# Patient Record
Sex: Female | Born: 1965 | Race: White | Hispanic: No | Marital: Married | Smoking: Current every day smoker
Health system: Southern US, Community
[De-identification: ages and names within clinical notes are randomized; demographics above are authoritative.]

## PROBLEM LIST (undated history)

## (undated) DIAGNOSIS — I10 Essential (primary) hypertension: Secondary | ICD-10-CM

## (undated) DIAGNOSIS — R002 Palpitations: Secondary | ICD-10-CM

## (undated) DIAGNOSIS — F419 Anxiety disorder, unspecified: Secondary | ICD-10-CM

## (undated) HISTORY — DX: Essential (primary) hypertension: I10

## (undated) HISTORY — PX: TUBAL LIGATION: SHX77

## (undated) HISTORY — DX: Palpitations: R00.2

## (undated) HISTORY — DX: Anxiety disorder, unspecified: F41.9

---

## 2010-03-24 ENCOUNTER — Institutional Professional Consult (permissible substitution): Payer: Self-pay | Admitting: Cardiovascular Disease

## 2010-03-30 ENCOUNTER — Institutional Professional Consult (permissible substitution) (INDEPENDENT_AMBULATORY_CARE_PROVIDER_SITE_OTHER): Payer: BC Managed Care – PPO | Admitting: Cardiovascular Disease

## 2010-03-30 DIAGNOSIS — I10 Essential (primary) hypertension: Secondary | ICD-10-CM

## 2010-03-30 DIAGNOSIS — R002 Palpitations: Secondary | ICD-10-CM

## 2011-06-08 ENCOUNTER — Encounter: Payer: Self-pay | Admitting: *Deleted

## 2014-05-28 LAB — HM PAP SMEAR: HM Pap smear: NEGATIVE

## 2016-03-01 LAB — HM MAMMOGRAPHY

## 2016-05-10 ENCOUNTER — Encounter: Payer: Self-pay | Admitting: Osteopathic Medicine

## 2016-05-10 ENCOUNTER — Ambulatory Visit (INDEPENDENT_AMBULATORY_CARE_PROVIDER_SITE_OTHER): Payer: BLUE CROSS/BLUE SHIELD | Admitting: Osteopathic Medicine

## 2016-05-10 VITALS — BP 127/79 | HR 56 | Ht 61.0 in | Wt 142.0 lb

## 2016-05-10 DIAGNOSIS — D72829 Elevated white blood cell count, unspecified: Secondary | ICD-10-CM | POA: Diagnosis not present

## 2016-05-10 DIAGNOSIS — I10 Essential (primary) hypertension: Secondary | ICD-10-CM | POA: Diagnosis not present

## 2016-05-10 DIAGNOSIS — F329 Major depressive disorder, single episode, unspecified: Secondary | ICD-10-CM | POA: Diagnosis not present

## 2016-05-10 DIAGNOSIS — Z780 Asymptomatic menopausal state: Secondary | ICD-10-CM | POA: Diagnosis not present

## 2016-05-10 DIAGNOSIS — F32A Depression, unspecified: Secondary | ICD-10-CM

## 2016-05-10 DIAGNOSIS — Z0000 Encounter for general adult medical examination without abnormal findings: Secondary | ICD-10-CM

## 2016-05-10 MED ORDER — FLUOXETINE HCL 20 MG PO TABS: 20.0000 mg | ORAL_TABLET | Freq: Every day | ORAL | 1 refills | Status: DC

## 2016-05-10 MED ORDER — LISINOPRIL 20 MG PO TABS: 20.0000 mg | ORAL_TABLET | Freq: Every day | ORAL | 3 refills | Status: DC

## 2016-05-10 MED ORDER — FLUTICASONE PROPIONATE 50 MCG/ACT NA SUSP: 2.0000 | Freq: Every day | NASAL | 0 refills | Status: DC

## 2016-05-10 NOTE — Progress Notes (Signed)
HPI: Anna Wilkinson is a 51 y.o. female  who [facility] [facility], [facility],  for chief complaint of:  Chief Complaint  Patient presents with  . Establish Care    Hypertension: Well-controlled on current medications. No chest pain, pressure, shortness of breath.  Depression: Patient has been on and off of fluoxetine for the past several years. Typically will stop it when she starts until better, restarts due to depression/menopausal symptoms. Would like to discuss alternative to capsules as these feel like getting stuck in her throat on occasion.  Postmenopausal: Greater than 1 year since last menstrual period. Occasional mood swings/hot flashes.    Past medical, surgical, social and family history reviewed: Patient Active Problem List   Diagnosis Date Noted  . Essential hypertension 05/10/2016  . Depression 05/10/2016  . Postmenopausal 05/10/2016   Past Surgical History:  Procedure Laterality Date  . TUBAL LIGATION     Social History  Substance Use Topics  . Smoking status: Former Smoker    Years: 20.00    Quit date: 07/07/2009  . Smokeless tobacco: Never Used  . Alcohol use Yes   Family History  Problem Relation Age of Onset  . Heart attack Father   . Hypertension Father   . Stroke Father      Current medication list and allergy/intolerance information reviewed:   Current Outpatient Prescriptions  Medication Sig Dispense Refill  . FLUoxetine (PROZAC) 40 MG capsule Take 40 mg by mouth daily.    Marland Kitchen. lisinopril (PRINIVIL,ZESTRIL) 20 MG tablet Take 20 mg by mouth daily.     No current facility-administered medications for this visit.    Allergies  Allergen Reactions  . Sulfa Antibiotics       Review of Systems:  Constitutional:  No  fever, no chills, No recent illness, No unintentional weight changes. No significant fatigue.   HEENT: No  headache, no vision change, no hearing change, No sore throat, No  sinus  pressure  Cardiac: No  chest pain, No  pressure, No palpitations, No  Orthopnea  Respiratory:  No  shortness of breath. No  Cough  Gastrointestinal: No  abdominal pain, No  nausea, No  vomiting,  No  blood in stool, No  diarrhea, No  constipation   Musculoskeletal: No new myalgia/arthralgia  Genitourinary: No  incontinence, No  abnormal genital bleeding, No abnormal genital discharge  Skin: No  Rash, No other wounds/concerning lesions  Hem/Onc: No  easy bruising/bleeding, No  abnormal lymph node  Endocrine: No cold intolerance,  No heat intolerance. No polyuria/polydipsia/polyphagia   Neurologic: No  weakness, No  dizziness, No  slurred speech/focal weakness/facial droop  Psychiatric: No  concerns with depression, No  concerns with anxiety, No sleep problems, No mood problems  Exam:  BP 127/79   Pulse (!) 56   Ht 5\' 1"  (1.549 m)   Wt 142 lb (64.4 kg)   BMI 26.83 kg/m   Constitutional: VS see above. General Appearance: alert, well-developed, well-nourished, NAD  Eyes: Normal lids and conjunctive, non-icteric sclera  Ears, Nose, Mouth, Throat: MMM, Normal external inspection ears/nares/mouth/lips/gums.   Neck: No masses, trachea midline. No thyroid enlargement. No tenderness/mass appreciated. No lymphadenopathy  Respiratory: Normal respiratory effort. no wheeze, no rhonchi, no rales  Cardiovascular: S1/S2 normal, no murmur, no rub/gallop auscultated. RRR. No lower extremity edema.    Musculoskeletal: Gait normal. No clubbing/cyanosis of digits.   Neurological: Normal balance/coordination. No tremor.  Skin: warm, dry, intact. No rash/ulcer..Marland Kitchen  Psychiatric: Normal judgment/insight. Normal mood and affect. Oriented x3.     ASSESSMENT/PLAN:   Essential hypertension - Plan: lisinopril (PRINIVIL,ZESTRIL) 20 MG tablet, COMPLETE METABOLIC PANEL WITH GFR, Lipid panel  Depression, unspecified depression type - Trial tablets of fluoxetine per patient preference, advised  may cost more. She would like to try decreased dose. Advised stay on for 6 mos prevent recurrence - Plan: FLUoxetine (PROZAC) 20 MG tablet  Postmenopausal - Plan: VITAMIN D 25 Hydroxy (Vit-D Deficiency, Fractures)  Annual physical exam - Plan: CBC with Differential/Platelet, COMPLETE METABOLIC PANEL WITH GFR, Lipid panel, VITAMIN D 25 Hydroxy (Vit-D Deficiency, Fractures), TSH    Visit summary with medication list and pertinent instructions was printed for patient to review. All questions at time of visit were answered - patient instructed to contact office with any additional concerns. ER/RTC precautions were reviewed with the patient. Follow-up plan: Return for ANNUAL PHYSICAL next 1-2 months, get fasting labs prior to that visit .   Please note: Labs ordered today for future annual physical visit. Preventive carer visit was not performed or billed today

## 2016-05-24 ENCOUNTER — Encounter: Payer: Self-pay | Admitting: Osteopathic Medicine

## 2016-05-24 ENCOUNTER — Ambulatory Visit (INDEPENDENT_AMBULATORY_CARE_PROVIDER_SITE_OTHER): Payer: BLUE CROSS/BLUE SHIELD | Admitting: Osteopathic Medicine

## 2016-05-24 VITALS — BP 147/80 | HR 62 | Temp 97.5°F | Resp 16 | Wt 142.0 lb

## 2016-05-24 DIAGNOSIS — L299 Pruritus, unspecified: Secondary | ICD-10-CM

## 2016-05-24 MED ORDER — BETAMETHASONE DIPROPIONATE 0.05 % EX CREA: TOPICAL_CREAM | Freq: Two times a day (BID) | CUTANEOUS | 0 refills | Status: DC

## 2016-05-24 MED ORDER — METHYLPREDNISOLONE SODIUM SUCC 125 MG IJ SOLR
80.0000 mg | Freq: Once | INTRAMUSCULAR | Status: AC
  Administered 2016-05-24: 80 mg via INTRAMUSCULAR

## 2016-05-24 NOTE — Progress Notes (Signed)
HPI: Anna Wilkinson is a 51 y.o. female  who presents to [facility] today, 05/24/16,  for chief complaint of:  Chief Complaint  Patient presents with  . itching without rash   New problem: . Context: itching started on hands and feet, seemed to spread.  . Location: everywhere at this point but worse in hands, feet back . Quality: itching . Duration: 3 days . Modifying factors: no OTC meds tried for itching. From GYN recently given Clotrimazole + Betamethasone x3 applications to external genitalia which helped the yeast infection (Rx from GYN for yeast infection - OTC meds helped discharge but not irritation to skin of vulva).  . Assoc signs/symptoms: no fever, no rash, no joint pain/swelling     Past medical history, surgical history, social history and family history reviewed.  Patient Active Problem List   Diagnosis Date Noted  . Essential hypertension 05/10/2016  . Depression 05/10/2016  . Postmenopausal 05/10/2016    Current medication list and allergy/intolerance information reviewed.   Current Outpatient Prescriptions on File Prior to Visit  Medication Sig Dispense Refill  . FLUoxetine (PROZAC) 20 MG tablet Take 1 tablet (20 mg total) by mouth daily. 90 tablet 1  . fluticasone (FLONASE) 50 MCG/ACT nasal spray Place 2 sprays into both nostrils daily. 16 g 0  . lisinopril (PRINIVIL,ZESTRIL) 20 MG tablet Take 1 tablet (20 mg total) by mouth daily. 90 tablet 3   No current facility-administered medications on file prior to visit.    Allergies  Allergen Reactions  . Sulfa Antibiotics       Review of Systems:  Constitutional: No recent illness  HEENT: No  headache  Cardiac: No  chest pain  Respiratory:  No  shortness of breath  Musculoskeletal: No new myalgia/arthralgia  Skin: No  Rash  Neurologic: No  weakness, No  Dizziness   Exam:  BP (!) 147/80 (BP Location: Left Arm, Patient Position: Sitting, Cuff Size: Normal)    Pulse 62   Temp 97.5 F (36.4 C) (Oral)   Resp 16   Wt 142 lb (64.4 kg)   SpO2 98%   BMI 26.83 kg/m   Constitutional: VS see above. General Appearance: alert, well-developed, well-nourished, NAD  Eyes: Normal lids and conjunctive, non-icteric sclera  Ears, Nose, Mouth, Throat: MMM, Normal external inspection ears/nares/mouth/lips/gums.  Neck: No masses, trachea midline.   Respiratory: Normal respiratory effort. no wheeze, no rhonchi, no rales  Cardiovascular: S1/S2 normal, no murmur, no rub/gallop auscultated. RRR.   Musculoskeletal: Gait normal. Symmetric and independent movement of all extremities  Neurological: Normal balance/coordination. No tremor.  Skin: warm, dry, intact. No rash. Small excoriation on lower back c/w scratching   Psychiatric: Normal judgment/insight. Normal mood and affect. Oriented x3.      ASSESSMENT/PLAN: Pruritus w/o rash, no known exposure other than recent medication change to tablets from capsules  Itching - Plan: betamethasone dipropionate (DIPROLENE) 0.05 % cream, methylPREDNISolone sodium succinate (SOLU-MEDROL) 125 mg/2 mL injection 80 mg    Patient Instructions  Plan: 1. Shot of steroids today  2. Antihistamines at home: Claritin, Allegra or Zyrtec 3. Steroid cream for severe itching 4. Recheck this on Friday (or sooner if needed), may need to consider switching back to capsules of fluoxetine     Follow-up plan: Return for follow-up as scheduled later this week, sooner if worse/change.  Visit summary with medication list and pertinent instructions was printed for patient to review, alert [place] if any changes needed. All questions at time  of visit were answered - patient instructed to contact office with any additional concerns. ER/RTC precautions were reviewed with the patient and understanding verbalized.

## 2016-05-24 NOTE — Patient Instructions (Signed)
Plan: 1. Shot of steroids today  2. Antihistamines at home: Claritin, Allegra or Zyrtec 3. Steroid cream for severe itching 4. Recheck this on Friday (or sooner if needed), may need to consider switching back to capsules of fluoxetine

## 2016-05-25 LAB — CBC WITH DIFFERENTIAL/PLATELET
BASOS ABS: 0 {cells}/uL (ref 0–200)
BASOS PCT: 0 %
EOS ABS: 0 {cells}/uL — AB (ref 15–500)
EOS PCT: 0 %
HCT: 45.1 % — ABNORMAL HIGH (ref 35.0–45.0)
Hemoglobin: 15.4 g/dL (ref 11.7–15.5)
LYMPHS ABS: 2702 {cells}/uL (ref 850–3900)
Lymphocytes Relative: 14 %
MCH: 32.5 pg (ref 27.0–33.0)
MCHC: 34.1 g/dL (ref 32.0–36.0)
MCV: 95.1 fL (ref 80.0–100.0)
MONO ABS: 1351 {cells}/uL — AB (ref 200–950)
MPV: 9.1 fL (ref 7.5–12.5)
Monocytes Relative: 7 %
NEUTROS ABS: 15247 {cells}/uL — AB (ref 1500–7800)
Neutrophils Relative %: 79 %
PLATELETS: 298 10*3/uL (ref 140–400)
RBC: 4.74 MIL/uL (ref 3.80–5.10)
RDW: 13.2 % (ref 11.0–15.0)
WBC: 19.3 10*3/uL — ABNORMAL HIGH (ref 3.8–10.8)

## 2016-05-25 LAB — LIPID PANEL
CHOLESTEROL: 184 mg/dL (ref <200)
HDL: 66 mg/dL (ref >50)
LDL Cholesterol: 104 mg/dL — ABNORMAL HIGH (ref <100)
Total CHOL/HDL Ratio: 2.8 Ratio (ref <5.0)
Triglycerides: 71 mg/dL (ref <150)
VLDL: 14 mg/dL (ref <30)

## 2016-05-25 LAB — COMPLETE METABOLIC PANEL WITH GFR
ALT: 22 U/L (ref 6–29)
AST: 16 U/L (ref 10–35)
Albumin: 4.1 g/dL (ref 3.6–5.1)
Alkaline Phosphatase: 122 U/L (ref 33–130)
BILIRUBIN TOTAL: 0.5 mg/dL (ref 0.2–1.2)
BUN: 17 mg/dL (ref 7–25)
CHLORIDE: 109 mmol/L (ref 98–110)
CO2: 19 mmol/L — AB (ref 20–31)
Calcium: 9.4 mg/dL (ref 8.6–10.4)
Creat: 0.86 mg/dL (ref 0.50–1.05)
GFR, EST NON AFRICAN AMERICAN: 78 mL/min (ref >=60)
GFR, Est African American: >89 mL/min (ref >=60)
GLUCOSE: 81 mg/dL (ref 65–99)
Potassium: 4.1 mmol/L (ref 3.5–5.3)
SODIUM: 141 mmol/L (ref 135–146)
TOTAL PROTEIN: 6.4 g/dL (ref 6.1–8.1)

## 2016-05-25 LAB — TSH: TSH: 0.71 mIU/L

## 2016-05-26 ENCOUNTER — Telehealth: Payer: Self-pay | Admitting: *Deleted

## 2016-05-26 LAB — VITAMIN D 25 HYDROXY (VIT D DEFICIENCY, FRACTURES): Vit D, 25-Hydroxy: 12 ng/mL — ABNORMAL LOW (ref 30–100)

## 2016-05-26 NOTE — Addendum Note (Signed)
Addended by: Deirdre Pippins on: 05/26/2016 08:04 AM   Modules accepted: Orders

## 2016-05-26 NOTE — Telephone Encounter (Signed)
closed

## 2016-05-27 ENCOUNTER — Ambulatory Visit (INDEPENDENT_AMBULATORY_CARE_PROVIDER_SITE_OTHER): Payer: BLUE CROSS/BLUE SHIELD | Admitting: Osteopathic Medicine

## 2016-05-27 ENCOUNTER — Encounter: Payer: Self-pay | Admitting: Osteopathic Medicine

## 2016-05-27 VITALS — BP 136/89 | HR 55 | Wt 142.0 lb

## 2016-05-27 DIAGNOSIS — E559 Vitamin D deficiency, unspecified: Secondary | ICD-10-CM

## 2016-05-27 DIAGNOSIS — Z0000 Encounter for general adult medical examination without abnormal findings: Secondary | ICD-10-CM | POA: Diagnosis not present

## 2016-05-27 DIAGNOSIS — F339 Major depressive disorder, recurrent, unspecified: Secondary | ICD-10-CM

## 2016-05-27 DIAGNOSIS — Z1211 Encounter for screening for malignant neoplasm of colon: Secondary | ICD-10-CM

## 2016-05-27 DIAGNOSIS — D729 Disorder of white blood cells, unspecified: Secondary | ICD-10-CM | POA: Diagnosis not present

## 2016-05-27 MED ORDER — FLUOXETINE HCL 20 MG PO CAPS: 20.0000 mg | ORAL_CAPSULE | Freq: Every day | ORAL | 3 refills | Status: DC

## 2016-05-27 MED ORDER — VITAMIN D (ERGOCALCIFEROL) 1.25 MG (50000 UNIT) PO CAPS: 50000.0000 [IU] | ORAL_CAPSULE | ORAL | 0 refills | Status: DC

## 2016-05-27 NOTE — Progress Notes (Signed)
HPI: Anna Wilkinson is a 51 y.o. female  who presents to [place] today, 05/27/16,  for chief complaint of:  Chief Complaint  Patient presents with  . Annual Exam    No complaints today: Itching has resolved. See below for review of preventive care. Of note, leukocytosis demonstrated on labs, however patient did not get labs done at time of last visit but rather came back subsequent day after receiving steroids. Would like Prozac switch back to capsule form after tablets probably are only explanation for itching reaction.   Past medical, surgical, social and family history reviewed: Patient Active Problem List   Diagnosis Date Noted  . Essential hypertension 05/10/2016  . Depression 05/10/2016  . Postmenopausal 05/10/2016   Past Surgical History:  Procedure Laterality Date  . TUBAL LIGATION     Social History  Substance Use Topics  . Smoking status: Former Smoker    Years: 20.00    Quit date: 07/07/2009  . Smokeless tobacco: Never Used  . Alcohol use Yes   Family History  Problem Relation Age of Onset  . Heart attack Father   . Hypertension Father   . Stroke Father   . Cancer Father   . Heart disease Mother   . Hypertension Mother   . Cancer Brother      Current medication list and allergy/intolerance information reviewed:   Current Outpatient Prescriptions  Medication Sig Dispense Refill  . fluticasone (FLONASE) 50 MCG/ACT nasal spray Place 2 sprays into both nostrils daily. 16 g 0  . lisinopril (PRINIVIL,ZESTRIL) 20 MG tablet Take 1 tablet (20 mg total) by mouth daily. 90 tablet 3  . FLUoxetine (PROZAC) 20 MG tablet Take 1 tablet (20 mg total) by mouth daily. (Patient not taking: Reported on 05/27/2016) 90 tablet 1   No current facility-administered medications for this visit.    Allergies  Allergen Reactions  . Sulfa Antibiotics       Review of Systems:  Constitutional:  No  fever, no chills, No recent illness, No  unintentional weight changes. No significant fatigue.   HEENT: No  headache, no vision change, no hearing change, No sore throat, No  sinus pressure  Cardiac: No  chest pain, No  pressure, No palpitations  Respiratory:  No  shortness of breath. No  Cough  Gastrointestinal: No  abdominal pain, No  nausea, No  vomiting  Musculoskeletal: No new myalgia/arthralgia  Skin: No  Rash, No other wounds/concerning lesions  Neurologic: No  weakness, No  dizziness  Psychiatric: No  concerns with depression, No  concerns with anxiety, No sleep problems, No mood problems  Exam:  BP 136/89   Pulse (!) 55   Wt 142 lb (64.4 kg)   BMI 26.83 kg/m   Constitutional: VS see above. General Appearance: alert, well-developed, well-nourished, NAD  Eyes: Normal lids and conjunctive, non-icteric sclera  Ears, Nose, Mouth, Throat: MMM, Normal external inspection ears/nares/mouth/lips/gums.   Neck: No masses, trachea midline. No thyroid enlargement. No tenderness/mass appreciated. No lymphadenopathy  Respiratory: Normal respiratory effort. no wheeze, no rhonchi, no rales  Cardiovascular: S1/S2 normal, no murmur, no rub/gallop auscultated. RRR. No lower extremity edema.   Musculoskeletal: Gait normal. No clubbing/cyanosis of digits.   Neurological: Normal balance/coordination. No tremor.   Skin: warm, dry, intact. No rash/ulcer  Psychiatric: Normal judgment/insight. Normal mood and affect. Oriented x3.    Results for orders placed or performed in visit on 05/10/16 (from the past 72 hour(s))  CBC with Differential/Platelet  Status: Abnormal   Collection Time: 05/25/16  9:37 AM  Result Value Ref Range   WBC 19.3 (H) 3.8 - 10.8 K/uL   RBC 4.74 3.80 - 5.10 MIL/uL   Hemoglobin 15.4 11.7 - 15.5 g/dL   HCT 45.1 (H) 35.0 - 45.0 %   MCV 95.1 80.0 - 100.0 fL   MCH 32.5 27.0 - 33.0 pg   MCHC 34.1 32.0 - 36.0 g/dL   RDW 13.2 11.0 - 15.0 %   Platelets 298 140 - 400 K/uL   MPV 9.1 7.5 - 12.5 fL    Neutro Abs 15,247 (H) 1,500 - 7,800 cells/uL   Lymphs Abs 2,702 850 - 3,900 cells/uL   Monocytes Absolute 1,351 (H) 200 - 950 cells/uL   Eosinophils Absolute 0 (L) 15 - 500 cells/uL   Basophils Absolute 0 0 - 200 cells/uL   Neutrophils Relative % 79 %   Lymphocytes Relative 14 %   Monocytes Relative 7 %   Eosinophils Relative 0 %   Basophils Relative 0 %   Smear Review Criteria for review not met   COMPLETE METABOLIC PANEL WITH GFR     Status: Abnormal   Collection Time: 05/25/16  9:37 AM  Result Value Ref Range   Sodium 141 135 - 146 mmol/L   Potassium 4.1 3.5 - 5.3 mmol/L   Chloride 109 98 - 110 mmol/L   CO2 19 (L) 20 - 31 mmol/L   Glucose, Bld 81 65 - 99 mg/dL   BUN 17 7 - 25 mg/dL   Creat 0.86 0.50 - 1.05 mg/dL    Comment:   For patients > or = 53 years of age: The upper reference limit for Creatinine is approximately 13% higher for people identified as African-American.      Total Bilirubin 0.5 0.2 - 1.2 mg/dL   Alkaline Phosphatase 122 33 - 130 U/L   AST 16 10 - 35 U/L   ALT 22 6 - 29 U/L   Total Protein 6.4 6.1 - 8.1 g/dL   Albumin 4.1 3.6 - 5.1 g/dL   Calcium 9.4 8.6 - 10.4 mg/dL   GFR, Est African American >89 >=60 mL/min   GFR, Est Non African American 78 >=60 mL/min  Lipid panel     Status: Abnormal   Collection Time: 05/25/16  9:37 AM  Result Value Ref Range   Cholesterol 184 <200 mg/dL   Triglycerides 71 <150 mg/dL   HDL 66 >50 mg/dL   Total CHOL/HDL Ratio 2.8 <5.0 Ratio   VLDL 14 <30 mg/dL   LDL Cholesterol 104 (H) <100 mg/dL  VITAMIN D 25 Hydroxy (Vit-D Deficiency, Fractures)     Status: Abnormal   Collection Time: 05/25/16  9:37 AM  Result Value Ref Range   Vit D, 25-Hydroxy 12 (L) 30 - 100 ng/mL    Comment: Vitamin D Status           25-OH Vitamin D        Deficiency                <20 ng/mL        Insufficiency         20 - 29 ng/mL        Optimal             > or = 30 ng/mL   For 25-OH Vitamin D testing on patients on D2-supplementation  and patients for whom quantitation of D2 and D3 fractions is required, the [facility] 25-OH VIT D, (  D2,D3), LC/MS/MS is recommended: order code [phone] (patients > 2 yrs).   TSH     Status: None   Collection Time: 05/25/16  9:37 AM  Result Value Ref Range   TSH 0.71 mIU/L    Comment:   Reference Range   > or = 20 Years  0.40-4.50   Pregnancy Range First trimester  0.26-2.66 Second trimester 0.55-2.73 Third trimester  0.43-2.91     Pathologist smear review     Status: None (Preliminary result)   Collection Time: 05/26/16  2:04 AM  Result Value Ref Range   Path Review      No results found.   ASSESSMENT/PLAN: Plan to repeat labs in next few weeks to confirm resolution of leukocytosis or to initiate further workup if leukocytosis is still present - pruritus has resolved, leukocytosis most likely due to steroid reaction. Pruritus can really present as a symptom of hematologic problems, this seems unlikely in the patient's case but would like to get other labs for confirmation sometime within the next month. Patient otherwise feeling well at this point.  Annual physical exam  Colon cancer screening - Plan: Ambulatory referral to Gastroenterology  Depression, recurrent ([place]) - Plan: FLUoxetine (PROZAC) 20 MG capsule  Abnormal WBC count - Plan: CBC with Differential/Platelet  Vitamin D deficiency - Plan: VITAMIN D 25 Hydroxy (Vit-D Deficiency, Fractures)   FEMALE PREVENTIVE CARE Updated 05/27/16   ANNUAL SCREENING/COUNSELING  Diet/Exercise - HEALTHY HABITS DISCUSSED TO DECREASE CV RISK History  Smoking Status  . Current Every Day Smoker  . Packs/day: 0.50  . Years: 30.00  . Types: Cigarettes  . Last attempt to quit: 07/07/2009  Smokeless Tobacco  . Never Used   History  Alcohol Use  . Yes   Depression screen PHQ 2/9 05/27/2016  Decreased Interest 0  Down, Depressed, Hopeless 0  PHQ - 2 Score 0    Domestic violence concerns - no  HTN SCREENING - SEE  [facility]  Sexually active in the past year - Yes with female.  Need/want STI testing today? - no  Concerns about libido or pain with sex? - no  Plans for pregnancy? - postmenosaual  INFECTIOUS DISEASE SCREENING  HIV - does not need  GC/CT - does not need  HepC - DOB [birth year] - does not need  TB - does not need  DISEASE SCREENING  Lipid - does not need  DM2 - does not need  Osteoporosis - women age 68+ - does not need  CANCER SCREENING  Cervical - does not need - following with OB/GYN  Breast - does not need - following with OB/GYN  Lung - does not need   Colon - needs - opts for colonoscopy  ADULT VACCINATION  Influenza - annual vaccine recommended  Td - booster every 10 years - declined  Zoster - option at 38, yes at 60+   PCV13 - was not indicated  PPSV23 - was not indicated  There is no immunization history on file for this patient.   Patient Instructions  Plan: 1. If itching recurs, please come see me! 2. Let's recheck the blood counts and vitamin D in 6-8 weeks or so - can just go to the lab  3. You should get a call to schedule colonoscopy 4. Recommend tetanus booster every 10 years and annual flu shot - call our office if you'd like to schedule a nurse visit to have these shots     Visit summary with medication list and pertinent  instructions was printed for patient to review. All questions at time of visit were answered - patient instructed to contact office with any additional concerns. ER/RTC precautions were reviewed with the patient. Follow-up plan: Return in about 1 year (around 05/27/2017) for [place], sooner if needed.

## 2016-05-27 NOTE — Patient Instructions (Signed)
Plan: 1. If itching recurs, please come see me! 2. Let's recheck the blood counts and vitamin D in 6-8 weeks or so - can just go to the lab  3. You should get a call to schedule colonoscopy 4. Recommend tetanus booster every 10 years and annual flu shot - call our office if you'd like to schedule a nurse visit to have these shots

## 2016-05-30 ENCOUNTER — Other Ambulatory Visit: Payer: Self-pay | Admitting: Osteopathic Medicine

## 2016-05-30 LAB — PATHOLOGIST SMEAR REVIEW

## 2016-06-06 ENCOUNTER — Encounter: Payer: Self-pay | Admitting: Osteopathic Medicine

## 2016-06-07 ENCOUNTER — Encounter: Payer: Self-pay | Admitting: Osteopathic Medicine

## 2016-11-02 ENCOUNTER — Ambulatory Visit (INDEPENDENT_AMBULATORY_CARE_PROVIDER_SITE_OTHER): Payer: BLUE CROSS/BLUE SHIELD | Admitting: Osteopathic Medicine

## 2016-11-02 VITALS — BP 168/93 | HR 70 | Temp 97.3°F | Wt 147.0 lb

## 2016-11-02 DIAGNOSIS — J40 Bronchitis, not specified as acute or chronic: Secondary | ICD-10-CM | POA: Diagnosis not present

## 2016-11-02 DIAGNOSIS — E559 Vitamin D deficiency, unspecified: Secondary | ICD-10-CM | POA: Diagnosis not present

## 2016-11-02 DIAGNOSIS — R05 Cough: Secondary | ICD-10-CM

## 2016-11-02 DIAGNOSIS — D729 Disorder of white blood cells, unspecified: Secondary | ICD-10-CM | POA: Diagnosis not present

## 2016-11-02 DIAGNOSIS — I10 Essential (primary) hypertension: Secondary | ICD-10-CM | POA: Diagnosis not present

## 2016-11-02 DIAGNOSIS — R058 Other specified cough: Secondary | ICD-10-CM

## 2016-11-02 MED ORDER — HYDROCODONE-HOMATROPINE 5-1.5 MG/5ML PO SYRP: 5.0000 mL | ORAL_SOLUTION | Freq: Four times a day (QID) | ORAL | 0 refills | Status: DC | PRN

## 2016-11-02 MED ORDER — AZITHROMYCIN 250 MG PO TABS: ORAL_TABLET | ORAL | 0 refills | Status: DC

## 2016-11-02 MED ORDER — METHYLPREDNISOLONE SODIUM SUCC 125 MG IJ SOLR
125.0000 mg | Freq: Once | INTRAMUSCULAR | Status: AC
  Administered 2016-11-02: 125 mg via INTRAMUSCULAR

## 2016-11-02 MED ORDER — FLUTICASONE PROPIONATE HFA 110 MCG/ACT IN AERO: 2.0000 | INHALATION_SPRAY | Freq: Two times a day (BID) | RESPIRATORY_TRACT | 1 refills | Status: DC

## 2016-11-02 NOTE — Progress Notes (Signed)
HPI: Anna Wilkinson is a 51 y.o. female  who presents to [facility] today, 11/02/16,  for chief complaint of:  Chief Complaint  Patient presents with  . Cough    Cough/illness . Context:sick with URI/cough symptoms a few weeks ago but cough has persisted . Location: chest . Quality: productive cough  . Duration: 3 weeks  . Modifying factors: Mucinex DM minimally helpful, was taking leftover Hycodan which was a bit better.    HTN: BP high today on intake. Lisinopril 20 mg daily.     Past medical, surgical, social and family history reviewed: Patient Active Problem List   Diagnosis Date Noted  . Abnormal WBC count 05/27/2016  . Vitamin D deficiency 05/27/2016  . Essential hypertension 05/10/2016  . Depression 05/10/2016  . Postmenopausal 05/10/2016   Past Surgical History:  Procedure Laterality Date  . TUBAL LIGATION     Social History  Substance Use Topics  . Smoking status: Current Every Day Smoker    Packs/day: 0.50    Years: 30.00    Types: Cigarettes    Last attempt to quit: 07/07/2009  . Smokeless tobacco: Never Used  . Alcohol use Yes   Family History  Problem Relation Age of Onset  . Heart attack Father   . Hypertension Father   . Stroke Father   . Cancer Father   . Heart disease Mother   . Hypertension Mother   . Cancer Brother      Current medication list and allergy/intolerance information reviewed:   Current Outpatient Prescriptions  Medication Sig Dispense Refill  . FLUoxetine (PROZAC) 20 MG capsule Take 1 capsule (20 mg total) by mouth daily. 90 capsule 3  . fluticasone (FLONASE) 50 MCG/ACT nasal spray Place 2 sprays into both nostrils daily. 16 g 0  . lisinopril (PRINIVIL,ZESTRIL) 20 MG tablet Take 1 tablet (20 mg total) by mouth daily. 90 tablet 3  . Vitamin D, Ergocalciferol, (DRISDOL) 50000 units CAPS capsule TAKE ONE CAPSULE BY MOUTH EVERY 7 HOURS 8 capsule 0   No current facility-administered  medications for this visit.    Allergies  Allergen Reactions  . Sulfa Antibiotics       Review of Systems:  Constitutional:  No  fever, no chills, +recent illness, No unintentional weight changes. No significant fatigue.   HEENT: No  headache, no vision change, no hearing change, No sore throat, +sinus pressure  Cardiac: No  chest pain, No  pressure, No palpitations,  Respiratory:  No  shortness of breath. +Cough  Gastrointestinal: No  abdominal pain, No  nausea, No  vomiting,  No  blood in stool, No  diarrhea, No  constipation   Musculoskeletal: No new myalgia/arthralgia  Exam:  BP (!) 168/93   Pulse 70   Temp (!) 97.3 F (36.3 C)   Wt 147 lb (66.7 kg)   SpO2 96%   BMI 27.78 kg/m   Constitutional: VS see above. General Appearance: alert, well-developed, well-nourished, NAD  Eyes: Normal lids and conjunctive, non-icteric sclera  Ears, Nose, Mouth, Throat: MMM, Normal external inspection ears/nares/mouth/lips/gums. TM normal bilaterally. Pharynx/tonsils no erythema, no exudate. Nasal mucosa normal.   Neck: No masses, trachea midline. No thyroid enlargement. No tenderness/mass appreciated. No lymphadenopathy  Respiratory: Normal respiratory effort. no wheeze, no rhonchi, no rales  Cardiovascular: S1/S2 normal, no murmur, no rub/gallop auscultated. RRR. No lower extremity edema.   Musculoskeletal: Gait normal.   Neurological: Normal balance/coordination. No tremor.   Skin: warm, dry, intact. No rash/ulcer.  Psychiatric: Normal judgment/insight. Normal mood and affect. Oriented x3.     ASSESSMENT/PLAN: The primary encounter diagnosis was Post-viral cough syndrome. Diagnoses of Bronchitis, Essential hypertension, Abnormal WBC count, and Vitamin D deficiency were also pertinent to this visit.   Orders Placed This Encounter  Procedures  . CBC with Differential/Platelet  . VITAMIN D 25 Hydroxy (Vit-D Deficiency, Fractures)   Administrations This Visit     methylPREDNISolone sodium succinate (SOLU-MEDROL) 125 mg/2 mL injection 125 mg    Admin Date 11/02/2016 Action Given Dose 125 mg Route Intramuscular Administered By Pixie Casino, CMA         Outpatient Encounter Prescriptions as of 11/02/2016  Medication Sig  . azithromycin (ZITHROMAX) 250 MG tablet 2 tabs po on Day 1, then 1 tab daily Days 2 - 5. Fill if symptoms not better with steroids, inhaler, cough medicine. Expires 11/14/16  . FLUoxetine (PROZAC) 20 MG capsule Take 1 capsule (20 mg total) by mouth daily.  . fluticasone (FLONASE) 50 MCG/ACT nasal spray Place 2 sprays into both nostrils daily.  . fluticasone (FLOVENT HFA) 110 MCG/ACT inhaler Inhale 2 puffs into the lungs 2 (two) times daily. For cough  . HYDROcodone-homatropine (HYCODAN) 5-1.5 MG/5ML syrup Take 5 mLs by mouth every 6 (six) hours as needed for cough.  Marland Kitchen lisinopril (PRINIVIL,ZESTRIL) 20 MG tablet Take 1 tablet (20 mg total) by mouth daily.  . Vitamin D, Ergocalciferol, (DRISDOL) 50000 units CAPS capsule TAKE ONE CAPSULE BY MOUTH EVERY 7 HOURS  . [EXPIRED] methylPREDNISolone sodium succinate (SOLU-MEDROL) 125 mg/2 mL injection 125 mg    No facility-administered encounter medications on file as of 11/02/2016.      Patient Instructions  Plan:  Steroid shot now Inhaler and cough medicine 2-3 days before think about filling antibiotic (azithromycin)  Fill antibiotic if no better with conservative treatment as above  If worse, come back to see [place] or urgent care for a chest xray   Also, we need to repeat labs once you are feeling better since your white blood cells were elevated and vitamin D was low on routine check at your annual physical. Orders were printed for you to get blood work done in a few weeks.     Visit summary with medication list and pertinent instructions was printed for patient to review. All questions at time of visit were answered - patient instructed to contact office with any additional  concerns. ER/RTC precautions were reviewed with the patient. Follow-up plan: Return in about 2 weeks (around 11/16/2016), or sooner yif symptoms worsen or fail to improve, for nurse visit blood pressure recheck .  Note: Total time spent 25 minutes, greater than 50% of the visit was spent face-to-face counseling and coordinating care for the following: The primary encounter diagnosis was Post-viral cough syndrome. Diagnoses of Bronchitis, Essential hypertension, Abnormal WBC count, and Vitamin D deficiency were also pertinent to this visit.Marland Kitchen

## 2016-11-02 NOTE — Patient Instructions (Addendum)
Plan:  Steroid shot now Inhaler and cough medicine 2-3 days before think about filling antibiotic (azithromycin)  Fill antibiotic if no better with conservative treatment as above  If worse, come back to see [place] or urgent care for a chest xray   Also, we need to repeat labs once you are feeling better since your white blood cells were elevated and vitamin D was low on routine check at your annual physical. Orders were printed for you to get blood work done in a few weeks.

## 2016-11-15 ENCOUNTER — Ambulatory Visit (INDEPENDENT_AMBULATORY_CARE_PROVIDER_SITE_OTHER): Payer: BLUE CROSS/BLUE SHIELD | Admitting: Osteopathic Medicine

## 2016-11-15 ENCOUNTER — Encounter: Payer: Self-pay | Admitting: Osteopathic Medicine

## 2016-11-15 ENCOUNTER — Ambulatory Visit (INDEPENDENT_AMBULATORY_CARE_PROVIDER_SITE_OTHER): Payer: BLUE CROSS/BLUE SHIELD

## 2016-11-15 VITALS — BP 152/92 | HR 57 | Ht 61.0 in | Wt 146.0 lb

## 2016-11-15 DIAGNOSIS — J0100 Acute maxillary sinusitis, unspecified: Secondary | ICD-10-CM | POA: Diagnosis not present

## 2016-11-15 DIAGNOSIS — R05 Cough: Secondary | ICD-10-CM

## 2016-11-15 DIAGNOSIS — R059 Cough, unspecified: Secondary | ICD-10-CM

## 2016-11-15 MED ORDER — IPRATROPIUM BROMIDE 0.06 % NA SOLN: 2.0000 | Freq: Four times a day (QID) | NASAL | 1 refills | Status: AC | PRN

## 2016-11-15 MED ORDER — HYDROCODONE-HOMATROPINE 5-1.5 MG/5ML PO SYRP: 5.0000 mL | ORAL_SOLUTION | Freq: Four times a day (QID) | ORAL | 0 refills | Status: DC | PRN

## 2016-11-15 MED ORDER — AMOXICILLIN-POT CLAVULANATE 875-125 MG PO TABS: 1.0000 | ORAL_TABLET | Freq: Two times a day (BID) | ORAL | 0 refills | Status: DC

## 2016-11-15 NOTE — Progress Notes (Signed)
HPI: Anna Wilkinson is a 50 y.o. female  who presents to [facility] today, 11/15/16,  for chief complaint of:  Chief Complaint  Patient presents with  . Follow-up    BLOOD PRESSURE  . Cough    Cough/illness . Context:sick with URI/cough symptoms a few weeks ago but cough has persisted, she saw me a few weeks ago and was treated for postviral cough syndrome/bronchitis with azithromycin, Flovent inhaler, cough syrup. At this point cough and nasal congestion are still a problem for her . Location: chest, sinuses . Quality: productive cough, nasal mucous/postnasal drip . Duration: 6 weeks  . Modifying factors: Mucinex DM minimally helpful, was taking leftover Hycodan which was a bit better. Medications as above prescribed at last visit   HTN: BP high today on intake. Lisinopril 20 mg daily. States she took decongestant earlier today and that's white blood pressure is high.    Past medical, surgical, social and family history reviewed: Patient Active Problem List   Diagnosis Date Noted  . Abnormal WBC count 05/27/2016  . Vitamin D deficiency 05/27/2016  . Essential hypertension 05/10/2016  . Depression 05/10/2016  . Postmenopausal 05/10/2016   Past Surgical History:  Procedure Laterality Date  . TUBAL LIGATION     Social History  Substance Use Topics  . Smoking status: Current Every Day Smoker    Packs/day: 0.50    Years: 30.00    Types: Cigarettes    Last attempt to quit: 07/07/2009  . Smokeless tobacco: Never Used  . Alcohol use Yes   Family History  Problem Relation Age of Onset  . Heart attack Father   . Hypertension Father   . Stroke Father   . Cancer Father   . Heart disease Mother   . Hypertension Mother   . Cancer Brother      Current medication list and allergy/intolerance information reviewed:   Current Outpatient Prescriptions  Medication Sig Dispense Refill  . FLUoxetine (PROZAC) 20 MG capsule Take 1 capsule  (20 mg total) by mouth daily. 90 capsule 3  . fluticasone (FLONASE) 50 MCG/ACT nasal spray Place 2 sprays into both nostrils daily. 16 g 0  . fluticasone (FLOVENT HFA) 110 MCG/ACT inhaler Inhale 2 puffs into the lungs 2 (two) times daily. For cough 1 Inhaler 1  . lisinopril (PRINIVIL,ZESTRIL) 20 MG tablet Take 1 tablet (20 mg total) by mouth daily. 90 tablet 3  . Vitamin D, Ergocalciferol, (DRISDOL) 50000 units CAPS capsule TAKE ONE CAPSULE BY MOUTH EVERY 7 HOURS 8 capsule 0   No current facility-administered medications for this visit.    Allergies  Allergen Reactions  . Sulfa Antibiotics       Review of Systems:  Constitutional:  No  fever, no chills, +recent illness, No unintentional weight changes. No significant fatigue.   HEENT: No  headache, no vision change, no hearing change, No sore throat, +sinus pressure  Cardiac: No  chest pain, No  pressure, No palpitations,  Respiratory:  No  shortness of breath. +Cough  Gastrointestinal: No  abdominal pain, No  nausea, No  vomiting,  No  blood in stool, No  diarrhea, No  constipation   Musculoskeletal: No new myalgia/arthralgia  Exam:  BP (!) 152/92   Pulse (!) 57   Ht  (1.549 m)   Wt 146 lb (66.2 kg)   BMI 27.59 kg/m   Constitutional: VS see above. General Appearance: alert, well-developed, well-nourished, NAD  Eyes: Normal lids and conjunctive, non-icteric sclera  Ears, Nose, Mouth, Throat: MMM, Normal external inspection ears/nares/mouth/lips/gums. TM normal bilaterally. Pharynx/tonsils no erythema, no exudate. Nasal mucosa normal.   Neck: No masses, trachea midline. No thyroid enlargement. No tenderness/mass appreciated. No lymphadenopathy  Respiratory: Normal respiratory effort. no wheeze, no rhonchi, no rales  Cardiovascular: S1/S2 normal, no murmur, no rub/gallop auscultated. RRR. No lower extremity edema.   Musculoskeletal: Gait normal.   Neurological: Normal balance/coordination. No tremor.   Skin:  warm, dry, intact. No rash/ulcer.   Psychiatric: Normal judgment/insight. Normal mood and affect. Oriented x3.   Chest x-ray on personal review shows no concerning infiltrate or other serious abnormality. Await radiology over read.  No results found.     ASSESSMENT/PLAN:   Acute maxillary sinusitis, recurrence not specified - If no improvement with antibiotics, would consider CT sinuses  Cough - I think more likely due to persistent postviral cough syndrome, sinusitis inadequately treated with azithromycin as we are more suspicious of bronchitis last ti - Plan: DG Chest 2 View     Orders Placed This Encounter  Procedures  . DG Chest 2 View    Order Specific Question:   Reason for exam:    Answer:   Cough, assess intra-thoracic pathology    Order Specific Question:   Is the patient pregnant?    Answer:   No    Order Specific Question:   Preferred imaging location?    Answer:   [facility]    Outpatient Encounter Prescriptions as of 11/15/2016  Medication Sig  . FLUoxetine (PROZAC) 20 MG capsule Take 1 capsule (20 mg total) by mouth daily.  . fluticasone (FLONASE) 50 MCG/ACT nasal spray HOLD for now, trial atrovent  Place 2 sprays into both nostrils daily.  . fluticasone (FLOVENT HFA) 110 MCG/ACT inhaler Inhale 2 puffs into the lungs 2 (two) times daily. For cough  . HYDROcodone-homatropine (HYCODAN) 5-1.5 MG/5ML syrup Take 5 mLs by mouth every 6 (six) hours as needed for cough.  Marland Kitchen lisinopril (PRINIVIL,ZESTRIL) 20 MG tablet Take 1 tablet (20 mg total) by mouth daily.  . Vitamin D, Ergocalciferol, (DRISDOL) 50000 units CAPS capsule TAKE ONE CAPSULE BY MOUTH EVERY 7 HOURS  . [DISCONTINUED] HYDROcodone-homatropine (HYCODAN) 5-1.5 MG/5ML syrup Take 5 mLs by mouth every 6 (six) hours as needed for cough.  Marland Kitchen amoxicillin-clavulanate (AUGMENTIN) 875-125 MG tablet Take 1 tablet by mouth 2 (two) times daily. For 7 days  . ipratropium (ATROVENT) 0.06 % nasal spray Place 2  sprays into both nostrils 4 (four) times daily as needed for rhinitis.  . [DISCONTINUED] azithromycin (ZITHROMAX) 250 MG tablet 2 tabs po on Day 1, then 1 tab daily Days 2 - 5. Fill if symptoms not better with steroids, inhaler, cough medicine. Expires 11/14/16 (Patient not taking: Reported on 11/15/2016)   No facility-administered encounter medications on file as of 11/15/2016.      Patient Instructions  Plan:  Start the inhaler - Flovent   Try new antibiotic - Augmentin   Start nasal spray - Atrovent  If still no better, will need to get CT scan of the sinuses and think about lung function testing +/- referral to a specialist   Consider PFT/pulmonology referral versus ENT depending on symptoms   Visit summary with medication list and pertinent instructions was printed for patient to review. All questions at time of visit were answered - patient instructed to contact office with any additional concerns. ER/RTC precautions were reviewed with the patient. Follow-up plan: Return recheck cough in 1-2 weeks, sooner if symptoms worsen  or change.  Note: Total time spent 25 minutes, greater than 50% of the visit was spent face-to-face counseling and coordinating care for the following: The primary encounter diagnosis was Acute maxillary sinusitis, recurrence not specified. A diagnosis of Cough was also pertinent to this visit.Marland Kitchen

## 2016-11-15 NOTE — Patient Instructions (Addendum)
Plan:  Start the inhaler - Flovent   Try new antibiotic - Augmentin   Start nasal spray - Atrovent  If still no better, will need to get CT scan of the sinuses and think about lung function testing +/- referral to a specialist

## 2016-11-16 ENCOUNTER — Ambulatory Visit: Payer: BLUE CROSS/BLUE SHIELD

## 2017-07-25 ENCOUNTER — Telehealth: Payer: Self-pay | Admitting: Osteopathic Medicine

## 2017-07-25 DIAGNOSIS — I10 Essential (primary) hypertension: Secondary | ICD-10-CM

## 2017-07-25 MED ORDER — LISINOPRIL 20 MG PO TABS: 20.0000 mg | ORAL_TABLET | Freq: Every day | ORAL | 0 refills | Status: DC

## 2017-07-25 NOTE — Telephone Encounter (Signed)
Pt called.  She needs a refill on her Lisinopril - 90 day supply. [facility] [address] [facility] to [facility] [place]. She's coming in for a physical on June 26th.

## 2017-07-25 NOTE — Telephone Encounter (Signed)
Rx sent. Pt must keep appointment.

## 2017-07-27 ENCOUNTER — Encounter: Payer: BLUE CROSS/BLUE SHIELD | Admitting: Osteopathic Medicine

## 2017-08-09 ENCOUNTER — Telehealth: Payer: Self-pay | Admitting: Osteopathic Medicine

## 2017-08-09 ENCOUNTER — Encounter: Payer: Self-pay | Admitting: Osteopathic Medicine

## 2017-08-09 ENCOUNTER — Ambulatory Visit (INDEPENDENT_AMBULATORY_CARE_PROVIDER_SITE_OTHER): Payer: BLUE CROSS/BLUE SHIELD | Admitting: Osteopathic Medicine

## 2017-08-09 VITALS — BP 150/90 | HR 49 | Temp 97.8°F | Wt 148.3 lb

## 2017-08-09 DIAGNOSIS — F339 Major depressive disorder, recurrent, unspecified: Secondary | ICD-10-CM | POA: Diagnosis not present

## 2017-08-09 DIAGNOSIS — R635 Abnormal weight gain: Secondary | ICD-10-CM

## 2017-08-09 DIAGNOSIS — Z0000 Encounter for general adult medical examination without abnormal findings: Secondary | ICD-10-CM

## 2017-08-09 DIAGNOSIS — I10 Essential (primary) hypertension: Secondary | ICD-10-CM

## 2017-08-09 DIAGNOSIS — Z716 Tobacco abuse counseling: Secondary | ICD-10-CM

## 2017-08-09 DIAGNOSIS — F17200 Nicotine dependence, unspecified, uncomplicated: Secondary | ICD-10-CM | POA: Diagnosis not present

## 2017-08-09 DIAGNOSIS — Z1211 Encounter for screening for malignant neoplasm of colon: Secondary | ICD-10-CM

## 2017-08-09 DIAGNOSIS — E559 Vitamin D deficiency, unspecified: Secondary | ICD-10-CM

## 2017-08-09 MED ORDER — FLUTICASONE PROPIONATE 50 MCG/ACT NA SUSP: 2.0000 | Freq: Every day | NASAL | 3 refills | Status: AC

## 2017-08-09 MED ORDER — HYDROCHLOROTHIAZIDE 12.5 MG PO TABS: 12.5000 mg | ORAL_TABLET | Freq: Every day | ORAL | 1 refills | Status: AC

## 2017-08-09 MED ORDER — FLUOXETINE HCL 20 MG PO CAPS: 20.0000 mg | ORAL_CAPSULE | Freq: Every day | ORAL | 3 refills | Status: AC

## 2017-08-09 NOTE — Telephone Encounter (Signed)
Added

## 2017-08-09 NOTE — Patient Instructions (Addendum)
General Preventive Care  Most recent routine screening lipids/other labs: ordered today   Tobacco: recommend stopping! Consider patches/gum or Wellbutrin or Chantix pills  Alcohol: moderation is ok for most people. Recreational/Illicit Drugs: don't!  Exercise: as tolerated to reduce risk of cardiovascular disease and diabetes  Mental health: if need for mental health care (medicines, counseling, other), or concerns about moods, please let me know!   Vaccines  Flu vaccine: recommended every fall (by Halloween!)  Shingles vaccine: Shingrix recommended after age 54, may adults have already had Zostavax, I've added you to our list to call when the vaccine is in stock   Pneumonia vaccines: Prevnar and Pneumovax recommended after age 365  Tetanus booster: Tdap booster recommended every 10 years  Cancer screenings   Colon cancer screening: colonoscopy referral - please let us know where you'd like to be seen and we can send a referral   Lung cancer screening: CT of the chest for smokers starting age 54 if average 1 pack per day over 30+ years  Breast cancer screening: mammogram recommended annually  Cervical cancer screening: continue follow-up with OBGYN for Pap  Infection screenings . HIV: recommended screening at least once age 54-65, more often if risk factors  . Hepatitis C: recommended for anyone born [id number]  Other . Aspirin: we may consider this based on risks (blood pressure, cholesterol once we have results back) . Bone Density Test: recommended for women at age 54, sooner if smoker . Advanced Directive: Living Will and/or Healthcare Power of Attorney recommended for everyone, regardless of age or health . Cholesterol & diabetes: recommended screening annually . Thyroid and Vitamin D: most insurance will not cover routine screening, but given your medical history we are checking these levels. If cost is a concern for you, please ask the lab to check on this before drawing  your blood

## 2017-08-09 NOTE — Telephone Encounter (Signed)
-----   Message from Sunnie NielsenNatalie Alexander, DO sent at 08/09/2017 11:29 AM EDT ----- Regarding: shingrix list Add to shingrix list please and thanks

## 2017-08-09 NOTE — Progress Notes (Addendum)
HPI: Anna Wilkinson is a 52 y.o. female  who presents to [facility] [place] today, 08/09/17,  for chief complaint of:  Annual physical    Patient here for annual physical / wellness exam.  See preventive care reviewed as below.  Recent labs reviewed in detail with the patient.   Additional concerns today include:   BP above goal, has taken meds today, no CP/SOB, no HA/VC  Doing well on Prozac    Past medical, surgical, social and family history reviewed: Patient Active Problem List   Diagnosis Date Noted  . Abnormal WBC count 05/27/2016  . Vitamin D deficiency 05/27/2016  . Essential hypertension 05/10/2016  . Depression 05/10/2016  . Postmenopausal 05/10/2016   Past Surgical History:  Procedure Laterality Date  . TUBAL LIGATION     Social History   Tobacco Use  . Smoking status: Current Every Day Smoker    Packs/day: 0.50    Years: 30.00    Pack years: 15.00    Types: Cigarettes    Last attempt to quit: 07/07/2009    Years since quitting: 8.0  . Smokeless tobacco: Never Used  Substance Use Topics  . Alcohol use: Yes   Family History  Problem Relation Age of Onset  . Heart attack Father   . Hypertension Father   . Stroke Father   . Cancer Father   . Heart disease Mother   . Hypertension Mother   . Cancer Brother      Current medication list and allergy/intolerance information reviewed:   Current Outpatient Medications  Medication Sig Dispense Refill  . FLUoxetine (PROZAC) 20 MG capsule Take 1 capsule (20 mg total) by mouth daily. 90 capsule 3  . fluticasone (FLOVENT HFA) 110 MCG/ACT inhaler Inhale 2 puffs into the lungs 2 (two) times daily. For cough 1 Inhaler 1  . ipratropium (ATROVENT) 0.06 % nasal spray Place 2 sprays into both nostrils 4 (four) times daily as needed for rhinitis. 15 mL 1  . lisinopril (PRINIVIL,ZESTRIL) 20 MG tablet Take 1 tablet (20 mg total) by mouth daily. 90 tablet 0  . fluticasone (FLONASE) 50  MCG/ACT nasal spray Place 2 sprays into both nostrils daily. 16 g 0   No current facility-administered medications for this visit.    Allergies  Allergen Reactions  . Sulfa Antibiotics       Review of Systems:  Constitutional:  No  fever, no chills, No recent illness, No unintentional weight changes. No significant fatigue.   HEENT: No  headache, no vision change, no hearing change, No sore throat, No  sinus pressure  Cardiac: No  chest pain, No  pressure, No palpitations  Respiratory:  No  shortness of breath. No  Cough  Gastrointestinal: No  abdominal pain, No  nausea, No  vomiting  Musculoskeletal: No new myalgia/arthralgia  Skin: No  Rash, No other wounds/concerning lesions  Neurologic: No  weakness, No  dizziness  Psychiatric: No  concerns with depression, No  concerns with anxiety, No sleep problems, No mood problems  Exam:  BP (!) 150/90 (BP Location: Left Arm, Patient Position: Sitting, Cuff Size: Normal)   Pulse (!) 49   Temp 97.8 F (36.6 C) (Oral)   Wt 148 lb 4.8 oz (67.3 kg)   BMI 28.02 kg/m   Constitutional: VS see above. General Appearance: alert, well-developed, well-nourished, NAD  Eyes: Normal lids and conjunctive, non-icteric sclera  Ears, Nose, Mouth, Throat: MMM, Normal external inspection ears/nares/mouth/lips/gums.   Neck: No masses, trachea midline.  No thyroid enlargement. No tenderness/mass appreciated. No lymphadenopathy  Respiratory: Normal respiratory effort. no wheeze, no rhonchi, no rales  Cardiovascular: S1/S2 normal, no murmur, no rub/gallop auscultated. RRR. No lower extremity edema.   Musculoskeletal: Gait normal. No clubbing/cyanosis of digits.   Neurological: Normal balance/coordination. No tremor.   Skin: warm, dry, intact. No rash/ulcer  Psychiatric: Normal judgment/insight. Normal mood and affect. Oriented x3.     ASSESSMENT/PLAN:   Annual physical exam - Plan: CBC, COMPLETE METABOLIC PANEL WITH GFR, Lipid panel, DG  Bone Density, TSH  Essential hypertension - added HCTZ - Plan: CBC, COMPLETE METABOLIC PANEL WITH GFR, Lipid panel, hydrochlorothiazide (HYDRODIURIL) 12.5 MG tablet  Depression, recurrent (HCC) - Plan: FLUoxetine (PROZAC) 20 MG capsule  Tobacco dependence - Plan: DG Bone Density  Tobacco abuse counseling  Weight gain - Plan: TSH  Vitamin D deficiency - Plan: VITAMIN D 25 Hydroxy (Vit-D Deficiency, Fractures)  Colon cancer screening     Patient Instructions  General Preventive Care  Most recent routine screening lipids/other labs: ordered today   Tobacco: recommend stopping! Consider patches/gum or Wellbutrin or Chantix pills  Alcohol: moderation is ok for most people. Recreational/Illicit Drugs: don't!  Exercise: as tolerated to reduce risk of cardiovascular disease and diabetes  Mental health: if need for mental health care (medicines, counseling, other), or concerns about moods, please let me know!   Vaccines  Flu vaccine: recommended every fall (by Halloween!)  Shingles vaccine: Shingrix recommended after age 54, may adults have already had Zostavax, I've added you to our list to call when the vaccine is in stock   Pneumonia vaccines: Prevnar and Pneumovax recommended after age 54  Tetanus booster: Tdap booster recommended every 10 years  Cancer screenings   Colon cancer screening: colonoscopy referral - please let us know where you'd like to be seen and we can send a referral   Lung cancer screening: CT of the chest for smokers starting age 54 if average 1 pack per day over 30+ years  Breast cancer screening: mammogram recommended annually  Cervical cancer screening: continue follow-up with OBGYN for Pap  Infection screenings . HIV: recommended screening at least once age 54-65, more often if risk factors  . Hepatitis C: recommended for anyone born [id number]  Other . Aspirin: we may consider this based on risks (blood pressure, cholesterol once we have  results back) . Bone Density Test: recommended for women at age 54, sooner if smoker . Advanced Directive: Living Will and/or Healthcare Power of Attorney recommended for everyone, regardless of age or health . Cholesterol & diabetes: recommended screening annually . Thyroid and Vitamin D: most insurance will not cover routine screening, but given your medical history we are checking these levels. If cost is a concern for you, please ask the lab to check on this before drawing your blood    Visit summary with medication list and pertinent instructions was printed for patient to review. All questions at time of visit were answered - patient instructed to contact office with any additional concerns. ER/RTC precautions were reviewed with the patient. Follow-up plan: Return for recheck BP in 2 weeks - nurse visit .    Addendum: ASCVD risk greater than 7.5, statin added.  Vitamin D refilled  Results for orders placed or performed in visit on 08/09/17 (from the past 72 hour(s))  CBC     Status: Abnormal   Collection Time: 08/09/17 11:52 AM  Result Value Ref Range   WBC 5.9 3.8 - 10.8 Thousand/uL  RBC 4.92 3.80 - 5.10 Million/uL   Hemoglobin 15.9 (H) 11.7 - 15.5 g/dL   HCT 69.6 (H) 29.5 - 28.4 %   MCV 93.5 80.0 - 100.0 fL   MCH 32.3 27.0 - 33.0 pg   MCHC 34.6 32.0 - 36.0 g/dL   RDW 13.2 44.0 - 10.2 %   Platelets 275 140 - 400 Thousand/uL   MPV 9.4 7.5 - 12.5 fL  COMPLETE METABOLIC PANEL WITH GFR     Status: None   Collection Time: 08/09/17 11:52 AM  Result Value Ref Range   Glucose, Bld 97 65 - 99 mg/dL    Comment: .            Fasting reference interval .    BUN 13 7 - 25 mg/dL   Creat 7.25 3.66 - 4.40 mg/dL    Comment: For patients >73 years of age, the reference limit for Creatinine is approximately 13% higher for people identified as African-American. .    GFR, Est Non African American 84 > OR = 60 mL/min/1.42m2   GFR, Est African American 98 > OR = 60 mL/min/1.29m2    BUN/Creatinine Ratio NOT APPLICABLE 6 - 22 (calc)   Sodium 139 135 - 146 mmol/L   Potassium 4.5 3.5 - 5.3 mmol/L   Chloride 105 98 - 110 mmol/L   CO2 27 20 - 32 mmol/L   Calcium 9.4 8.6 - 10.4 mg/dL   Total Protein 6.6 6.1 - 8.1 g/dL   Albumin 4.3 3.6 - 5.1 g/dL   Globulin 2.3 1.9 - 3.7 g/dL (calc)   AG Ratio 1.9 1.0 - 2.5 (calc)   Total Bilirubin 0.7 0.2 - 1.2 mg/dL   Alkaline phosphatase (APISO) 124 33 - 130 U/L   AST 15 10 - 35 U/L   ALT 17 6 - 29 U/L  Lipid panel     Status: Abnormal   Collection Time: 08/09/17 11:52 AM  Result Value Ref Range   Cholesterol 203 (H) <200 mg/dL   HDL 50 (L) >34 mg/dL   Triglycerides 742 <595 mg/dL   LDL Cholesterol (Calc) 130 (H) mg/dL (calc)    Comment: Reference range: <100 . Desirable range <100 mg/dL for primary prevention;   <70 mg/dL for patients with CHD or diabetic patients  with > or = 2 CHD risk factors. Marland Kitchen LDL-C is now calculated using the Martin-Hopkins  calculation, which is a validated novel method providing  better accuracy than the Friedewald equation in the  estimation of LDL-C.  Horald Pollen et al. Lenox Ahr. 6387;564(33): 2061-2068  (http://education.[facility].com/faq/FAQ164)    Total CHOL/HDL Ratio 4.1 <5.0 (calc)   Non-HDL Cholesterol (Calc) 153 (H) <130 mg/dL (calc)    Comment: For patients with diabetes plus 1 major ASCVD risk  factor, treating to a non-HDL-C goal of <100 mg/dL  (LDL-C of <29 mg/dL) is considered a therapeutic  option.   VITAMIN D 25 Hydroxy (Vit-D Deficiency, Fractures)     Status: Abnormal   Collection Time: 08/09/17 11:52 AM  Result Value Ref Range   Vit D, 25-Hydroxy 18 (L) 30 - 100 ng/mL    Comment: Vitamin D Status         25-OH Vitamin D: . Deficiency:                    <20 ng/mL Insufficiency:             20 - 29 ng/mL Optimal:                 >  or = 30 ng/mL . For 25-OH Vitamin D testing on patients on  D2-supplementation and patients for whom quantitation  of D2 and D3 fractions  is required, the QuestAssureD(TM) 25-OH VIT D, (D2,D3), LC/MS/MS is recommended: order  code 16109 (patients >46yrs). . For more information on this test, go to: http://education.quest[facility]ostics.com/faq/FAQ163 (This link is being provided for  informational/educational purposes only.)   TSH     Status: None   Collection Time: 08/09/17 11:52 AM  Result Value Ref Range   TSH 0.85 mIU/L    Comment:           Reference Range .           > or = 20 Years  0.40-4.50 .                Pregnancy Ranges           First trimester    0.26-2.66           Second trimester   0.55-2.73           Third trimester    0.43-2.91

## 2017-08-10 LAB — CBC
HCT: 46 % — ABNORMAL HIGH (ref 35.0–45.0)
HEMOGLOBIN: 15.9 g/dL — AB (ref 11.7–15.5)
MCH: 32.3 pg (ref 27.0–33.0)
MCHC: 34.6 g/dL (ref 32.0–36.0)
MCV: 93.5 fL (ref 80.0–100.0)
MPV: 9.4 fL (ref 7.5–12.5)
Platelets: 275 10*3/uL (ref 140–400)
RBC: 4.92 10*6/uL (ref 3.80–5.10)
RDW: 12.4 % (ref 11.0–15.0)
WBC: 5.9 10*3/uL (ref 3.8–10.8)

## 2017-08-10 LAB — LIPID PANEL
CHOLESTEROL: 203 mg/dL — AB (ref <200)
HDL: 50 mg/dL — AB (ref >50)
LDL Cholesterol (Calc): 130 mg/dL (calc) — ABNORMAL HIGH
Non-HDL Cholesterol (Calc): 153 mg/dL (calc) — ABNORMAL HIGH (ref <130)
TRIGLYCERIDES: 118 mg/dL (ref <150)
Total CHOL/HDL Ratio: 4.1 (calc) (ref <5.0)

## 2017-08-10 LAB — COMPLETE METABOLIC PANEL WITH GFR
AG RATIO: 1.9 (calc) (ref 1.0–2.5)
ALBUMIN MSPROF: 4.3 g/dL (ref 3.6–5.1)
ALT: 17 U/L (ref 6–29)
AST: 15 U/L (ref 10–35)
Alkaline phosphatase (APISO): 124 U/L (ref 33–130)
BUN: 13 mg/dL (ref 7–25)
CALCIUM: 9.4 mg/dL (ref 8.6–10.4)
CO2: 27 mmol/L (ref 20–32)
CREATININE: 0.8 mg/dL (ref 0.50–1.05)
Chloride: 105 mmol/L (ref 98–110)
GFR, EST AFRICAN AMERICAN: 98 mL/min/{1.73_m2} (ref >=60)
GFR, EST NON AFRICAN AMERICAN: 84 mL/min/{1.73_m2} (ref >=60)
Globulin: 2.3 g/dL (calc) (ref 1.9–3.7)
Glucose, Bld: 97 mg/dL (ref 65–99)
POTASSIUM: 4.5 mmol/L (ref 3.5–5.3)
SODIUM: 139 mmol/L (ref 135–146)
Total Bilirubin: 0.7 mg/dL (ref 0.2–1.2)
Total Protein: 6.6 g/dL (ref 6.1–8.1)

## 2017-08-10 LAB — TSH: TSH: 0.85 m[IU]/L

## 2017-08-10 LAB — VITAMIN D 25 HYDROXY (VIT D DEFICIENCY, FRACTURES): Vit D, 25-Hydroxy: 18 ng/mL — ABNORMAL LOW (ref 30–100)

## 2017-08-10 MED ORDER — ATORVASTATIN CALCIUM 20 MG PO TABS
20.0000 mg | ORAL_TABLET | Freq: Every day | ORAL | 3 refills | Status: DC
Start: 2017-08-10 — End: 2017-12-29

## 2017-08-10 MED ORDER — VITAMIN D (ERGOCALCIFEROL) 1.25 MG (50000 UNIT) PO CAPS: 50000.0000 [IU] | ORAL_CAPSULE | ORAL | 0 refills | Status: AC

## 2017-08-10 NOTE — Addendum Note (Signed)
Addended by: Deirdre PippinsALEXANDER, Fareeha Evon M on: 08/10/2017 12:33 PM   Modules accepted: Orders

## 2017-08-23 ENCOUNTER — Ambulatory Visit: Payer: BLUE CROSS/BLUE SHIELD

## 2017-08-29 ENCOUNTER — Ambulatory Visit (INDEPENDENT_AMBULATORY_CARE_PROVIDER_SITE_OTHER): Payer: BLUE CROSS/BLUE SHIELD | Admitting: Osteopathic Medicine

## 2017-08-29 VITALS — BP 132/74 | HR 58 | Wt 144.0 lb

## 2017-08-29 DIAGNOSIS — Z1211 Encounter for screening for malignant neoplasm of colon: Secondary | ICD-10-CM

## 2017-08-29 DIAGNOSIS — E7800 Pure hypercholesterolemia, unspecified: Secondary | ICD-10-CM | POA: Diagnosis not present

## 2017-08-29 NOTE — Progress Notes (Signed)
Pt came into clinic today for BP check. Pt reports taking the new Rx, reports no negative side effects. Pt does state she is not taking the new Rx for Lipitor. Pt would like to try for 3 months with diet and exercise to bring it down. Will pend lab order for recheck. Pt advised I would contact her with any recommendations from PCP.  Pt is OK to place order for colonoscopy. She prefers one in the [place] area. She did not like digestive health.   Pt also mentioned getting PCP to refill her cough syrup. Advised she would need an OV for eval on this. Verbalized understanding.

## 2017-08-30 NOTE — Progress Notes (Signed)
BP 132/74   Pulse (!) 58   Wt 144 lb (65.3 kg)   SpO2 97%   BMI 27.21 kg/m   Blood pressure looks okay, ideally would like to get it under 130 systolic, and discuss at future visit.  I signed off on the other orders.

## 2017-08-31 ENCOUNTER — Other Ambulatory Visit: Payer: Self-pay | Admitting: Osteopathic Medicine

## 2017-08-31 DIAGNOSIS — I10 Essential (primary) hypertension: Secondary | ICD-10-CM

## 2017-10-02 ENCOUNTER — Encounter: Payer: Self-pay | Admitting: Osteopathic Medicine

## 2017-10-18 ENCOUNTER — Other Ambulatory Visit: Payer: Self-pay | Admitting: Osteopathic Medicine

## 2017-10-18 DIAGNOSIS — I10 Essential (primary) hypertension: Secondary | ICD-10-CM

## 2017-10-28 ENCOUNTER — Other Ambulatory Visit: Payer: Self-pay | Admitting: Osteopathic Medicine

## 2017-10-30 NOTE — Telephone Encounter (Signed)
[facility] pharmacy requesting med RF for vit D 50000u. Pls advise if RF is appropriate. Thanks.

## 2017-10-31 NOTE — Telephone Encounter (Signed)
Typically after 12 weeks of treatment, continuation of high-dose supplementation is not appropriate, instead transition to 1000 to 2000 units daily over-the-counter supplement.  It is difficult to overdose on vitamin D, but it can be done!

## 2017-10-31 NOTE — Telephone Encounter (Signed)
Left detailed vm msg for pt regarding provider's note. Call back information provided.

## 2017-12-29 ENCOUNTER — Ambulatory Visit (INDEPENDENT_AMBULATORY_CARE_PROVIDER_SITE_OTHER): Payer: BLUE CROSS/BLUE SHIELD | Admitting: Family Medicine

## 2017-12-29 ENCOUNTER — Encounter: Payer: Self-pay | Admitting: Family Medicine

## 2017-12-29 VITALS — BP 152/99 | HR 69 | Temp 97.9°F | Ht 61.0 in | Wt 143.0 lb

## 2017-12-29 DIAGNOSIS — R05 Cough: Secondary | ICD-10-CM | POA: Diagnosis not present

## 2017-12-29 DIAGNOSIS — R059 Cough, unspecified: Secondary | ICD-10-CM

## 2017-12-29 MED ORDER — HYDROCODONE-HOMATROPINE 5-1.5 MG/5ML PO SYRP: 5.0000 mL | ORAL_SOLUTION | Freq: Three times a day (TID) | ORAL | 0 refills | Status: AC | PRN

## 2017-12-29 MED ORDER — CEFDINIR 300 MG PO CAPS: 300.0000 mg | ORAL_CAPSULE | Freq: Two times a day (BID) | ORAL | 0 refills | Status: AC

## 2017-12-29 MED ORDER — PREDNISONE 10 MG PO TABS: 30.0000 mg | ORAL_TABLET | Freq: Every day | ORAL | 0 refills | Status: AC

## 2017-12-29 MED ORDER — BUDESONIDE-FORMOTEROL FUMARATE 160-4.5 MCG/ACT IN AERO: 2.0000 | INHALATION_SPRAY | Freq: Two times a day (BID) | RESPIRATORY_TRACT | 3 refills | Status: AC

## 2017-12-29 NOTE — Progress Notes (Signed)
Anna Wilkinson is a 52 y.o. female who presents to [facility] today for cough congestion sinus pressure runny nose for the last 6 days.  Patient notes wheezing but denies severe shortness of breath.  She has tried over-the-counter cough medications which helped a bit.  Patient no longer has an albuterol inhaler.  She has been using a fluticasone inhaler intermittently which has not helped much.  She does smoke and is never been diagnosed with COPD.   ROS as above:  Exam:  BP (!) 152/99   Pulse 69   Temp 97.9 F (36.6 C) (Oral)   Ht 5\' 1"  (1.549 m)   Wt 143 lb (64.9 kg)   HC 95" (241.3 cm)   BMI 27.02 kg/m  Wt Readings from Last 5 Encounters:  12/29/17 143 lb (64.9 kg)  08/29/17 144 lb (65.3 kg)  08/09/17 148 lb 4.8 oz (67.3 kg)  11/15/16 146 lb (66.2 kg)  11/02/16 147 lb (66.7 kg)    Gen: Well NAD HEENT: EOMI,  MMM normal posterior pharynx and tympanic membranes.  Clear nasal discharge.  Mildly tender to palpation maxillary sinus. Lungs: Normal work of breathing.  Slight wheezing bilaterally are otherwise. Heart: RRR no MRG Abd: NABS, Soft. Nondistended, Nontender Exts: Brisk capillary refill, warm and well perfused.   Lab and Radiology Results No results found for this or any previous visit (from the past 72 hour(s)). No results found.    Assessment and Plan: 52 y.o. female with cough likely bronchitis versus early COPD exacerbation.  Dispense Symbicort inhaler.  Prescribed Hycodan cough syrup and prednisone.  Back-up Omnicef if not better.  Been smoking cessation.  Follow-up with PCP in 1 month for potential COPD evaluation.   No orders of the defined types were placed in this encounter.  Meds ordered this encounter  Medications  . predniSONE (DELTASONE) 10 MG tablet    Sig: Take 3 tablets (30 mg total) by mouth daily with breakfast.   Dispense:  15 tablet    Refill:  0  . HYDROcodone-homatropine (HYCODAN) 5-1.5 MG/5ML syrup    Sig: Take 5 mLs by mouth every 8 (eight) hours as needed for cough.    Dispense:  120 mL    Refill:  0  . budesonide-formoterol (SYMBICORT) 160-4.5 MCG/ACT inhaler    Sig: Inhale 2 puffs into the lungs 2 (two) times daily.    Dispense:  1 Inhaler    Refill:  3  . cefdinir (OMNICEF) 300 MG capsule    Sig: Take 1 capsule (300 mg total) by mouth 2 (two) times daily.    Dispense:  14 capsule    Refill:  0     Historical information moved to improve visibility of documentation.  Past Medical History:  Diagnosis Date  . Anxiety   . Hypertension   . Palpitations    Past Surgical History:  Procedure Laterality Date  . TUBAL LIGATION     Social History   Tobacco Use  . Smoking status: Current Every Day Smoker    Packs/day: 0.50    Years: 20.00    Pack years: 10.00    Types: Cigarettes    Last attempt to quit: 07/07/2009    Years since quitting: 8.4  . Smokeless tobacco: Never Used  Substance Use Topics  . Alcohol use: Yes   family history includes Cancer in her brother and father; Heart attack in her father; Heart disease in her mother;  Hypertension in her father and mother; Stroke in her father.  Medications: Current Outpatient Medications  Medication Sig Dispense Refill  . FLUoxetine (PROZAC) 20 MG capsule Take 1 capsule (20 mg total) by mouth daily. 90 capsule 3  . fluticasone (FLONASE) 50 MCG/ACT nasal spray Place 2 sprays into both nostrils daily. 48 g 3  . hydrochlorothiazide (HYDRODIURIL) 12.5 MG tablet Take 1 tablet (12.5 mg total) by mouth daily. 30 tablet 1  . ipratropium (ATROVENT) 0.06 % nasal spray Place 2 sprays into both nostrils 4 (four) times daily as needed for rhinitis. 15 mL 1  . lisinopril (PRINIVIL,ZESTRIL) 20 MG tablet TAKE 1 TABLET BY MOUTH EVERY DAY 90 tablet 0  . Vitamin D, Ergocalciferol, (DRISDOL) 50000 units CAPS capsule Take 1 capsule (50,000 Units  total) by mouth every 7 (seven) days. Take for 12 total doses(weeks) 12 capsule 0  . budesonide-formoterol (SYMBICORT) 160-4.5 MCG/ACT inhaler Inhale 2 puffs into the lungs 2 (two) times daily. 1 Inhaler 3  . cefdinir (OMNICEF) 300 MG capsule Take 1 capsule (300 mg total) by mouth 2 (two) times daily. 14 capsule 0  . HYDROcodone-homatropine (HYCODAN) 5-1.5 MG/5ML syrup Take 5 mLs by mouth every 8 (eight) hours as needed for cough. 120 mL 0  . predniSONE (DELTASONE) 10 MG tablet Take 3 tablets (30 mg total) by mouth daily with breakfast. 15 tablet 0   No current facility-administered medications for this visit.    Allergies  Allergen Reactions  . Sulfa Antibiotics      Discussed warning signs or symptoms. Please see discharge instructions. Patient expresses understanding.

## 2017-12-29 NOTE — Patient Instructions (Addendum)
Thank you for coming in today. Call or go to the emergency room if you get worse, have trouble breathing, have chest pains, or palpitations.   Continue over the counter medicine.  Use the hycodan cough medicine as needed at bedtime.  Take the prednisone.  Use symbicort inhailler.   In not better or if worse fill and take symbicort inhailler.   Consider smoking cessation.   Follow up in 1 month with Dr Lyn HollingsheadAlexander for COPD screening and evaluation.      Acute Bronchitis, Adult Acute bronchitis is when air tubes (bronchi) in the lungs suddenly get swollen. The condition can make it hard to breathe. It can also cause these symptoms:  A cough.  Coughing up clear, yellow, or green mucus.  Wheezing.  Chest congestion.  Shortness of breath.  A fever.  Body aches.  Chills.  A sore throat.  Follow these instructions at home: Medicines  Take over-the-counter and prescription medicines only as told by your doctor.  If you were prescribed an antibiotic medicine, take it as told by your doctor. Do not stop taking the antibiotic even if you start to feel better. General instructions  Rest.  Drink enough fluids to keep your pee (urine) clear or pale yellow.  Avoid smoking and secondhand smoke. If you smoke and you need help quitting, ask your doctor. Quitting will help your lungs heal faster.  Use an inhaler, cool mist vaporizer, or humidifier as told by your doctor.  Keep all follow-up visits as told by your doctor. This is important. How is this prevented? To lower your risk of getting this condition again:  Wash your hands often with soap and water. If you cannot use soap and water, use hand sanitizer.  Avoid contact with people who have cold symptoms.  Try not to touch your hands to your mouth, nose, or eyes.  Make sure to get the flu shot every year.  Contact a doctor if:  Your symptoms do not get better in 2 weeks. Get help right away if:  You cough up  blood.  You have chest pain.  You have very bad shortness of breath.  You become dehydrated.  You faint (pass out) or keep feeling like you are going to pass out.  You keep throwing up (vomiting).  You have a very bad headache.  Your fever or chills gets worse. This information is not intended to replace advice given to you by your health care provider. Make sure you discuss any questions you have with your health care provider. Document Released: 07/20/2007 Document Revised: 09/09/2015 Document Reviewed: 07/22/2015 Elsevier Interactive Patient Education  [employer]2018 Else[employer] Inc.

## 2018-02-06 ENCOUNTER — Other Ambulatory Visit: Payer: Self-pay | Admitting: Osteopathic Medicine

## 2018-02-06 DIAGNOSIS — I10 Essential (primary) hypertension: Secondary | ICD-10-CM

## 2018-03-08 NOTE — Telephone Encounter (Signed)
Multiple messages have been left for pt to schedule Shingrix, no call back. Pt being removed from waitlist, can be re-added if she would like vaccine.   FYI to PCP

## 2018-08-18 ENCOUNTER — Other Ambulatory Visit: Payer: Self-pay | Admitting: Osteopathic Medicine

## 2018-08-18 DIAGNOSIS — I10 Essential (primary) hypertension: Secondary | ICD-10-CM

## 2018-10-08 IMAGING — DX DG CHEST 2V
2 series · 2 of 2 positions shown · non-contrast
Comparison: None.

CLINICAL DATA: Cough

EXAM:
CHEST  2 VIEW

[chest pa]
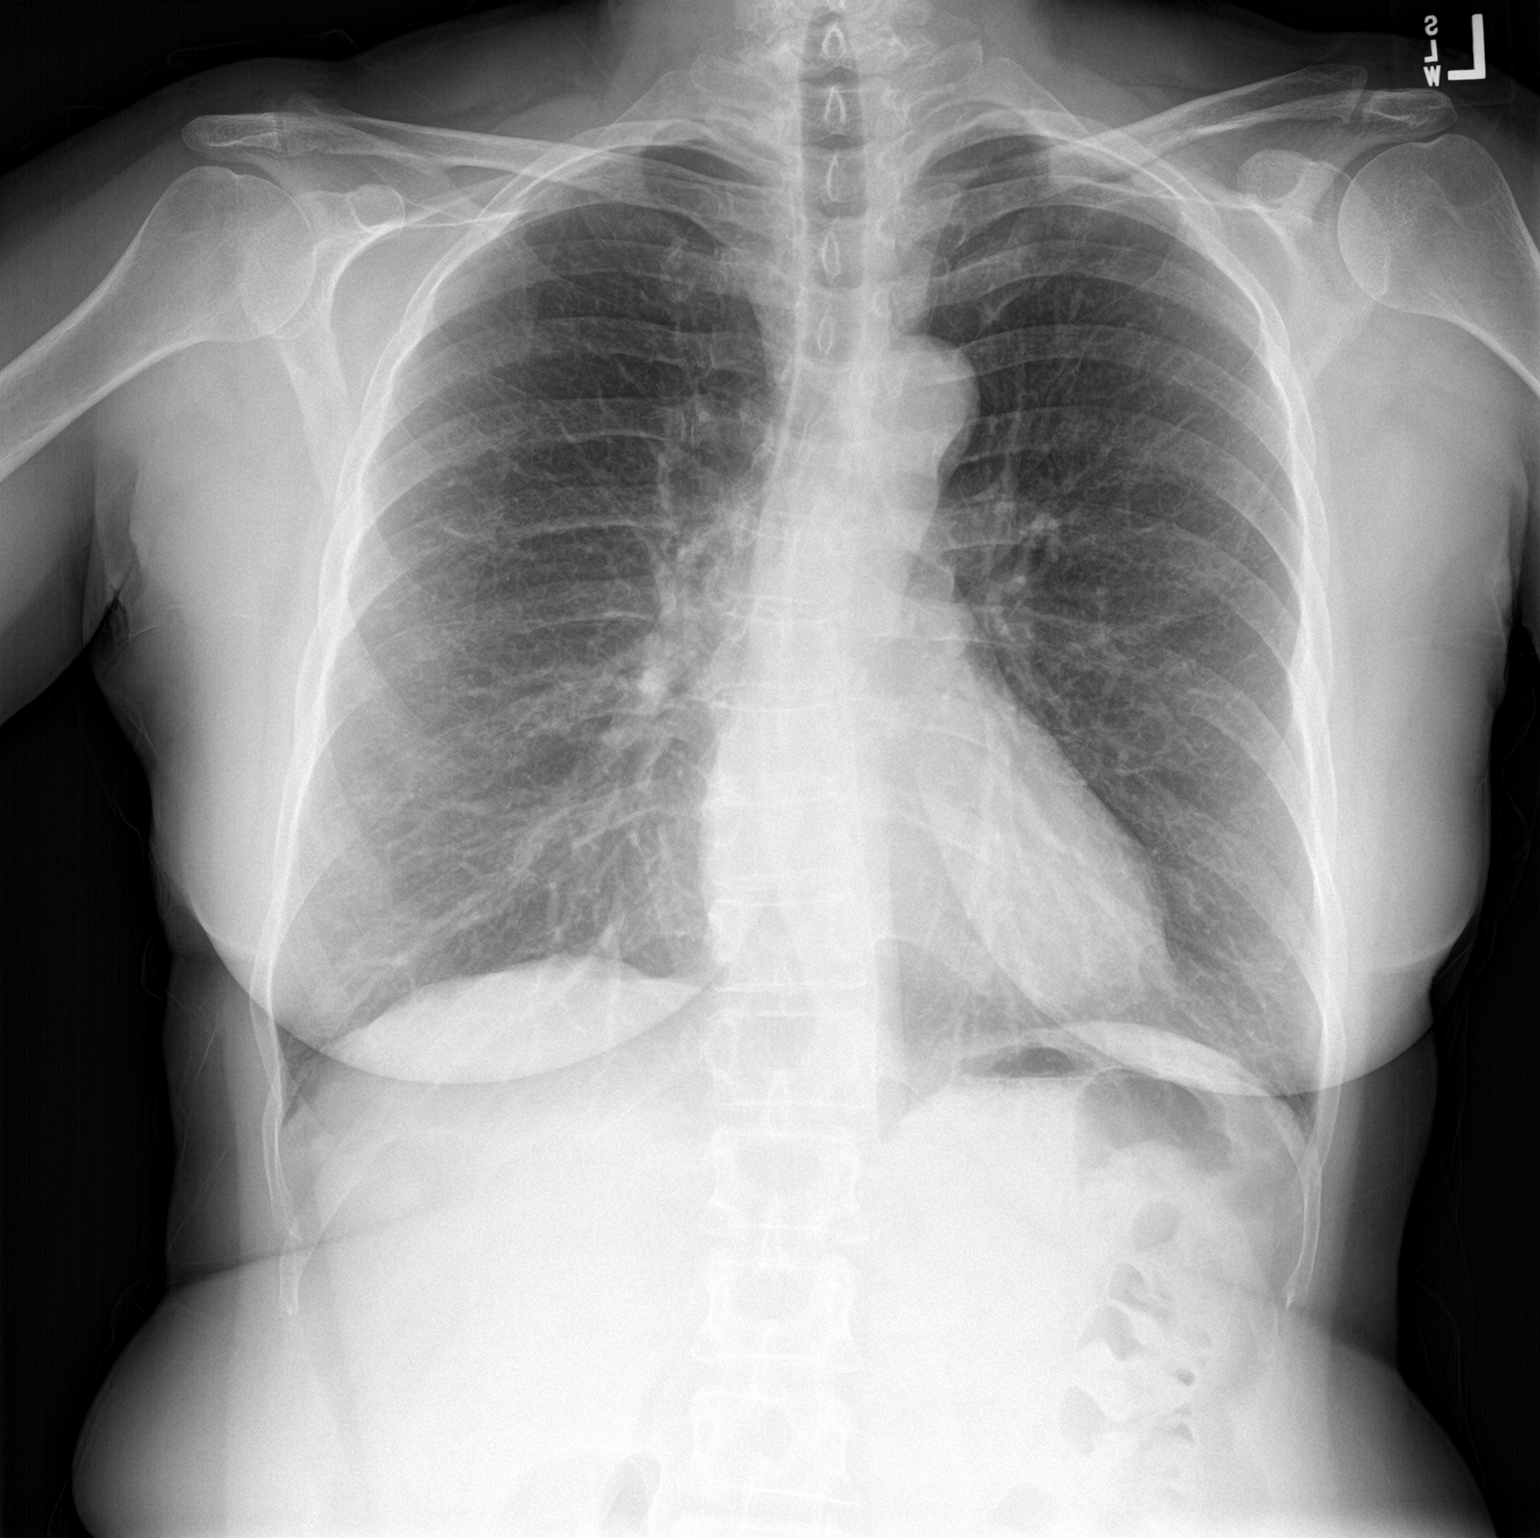

[chest lat]
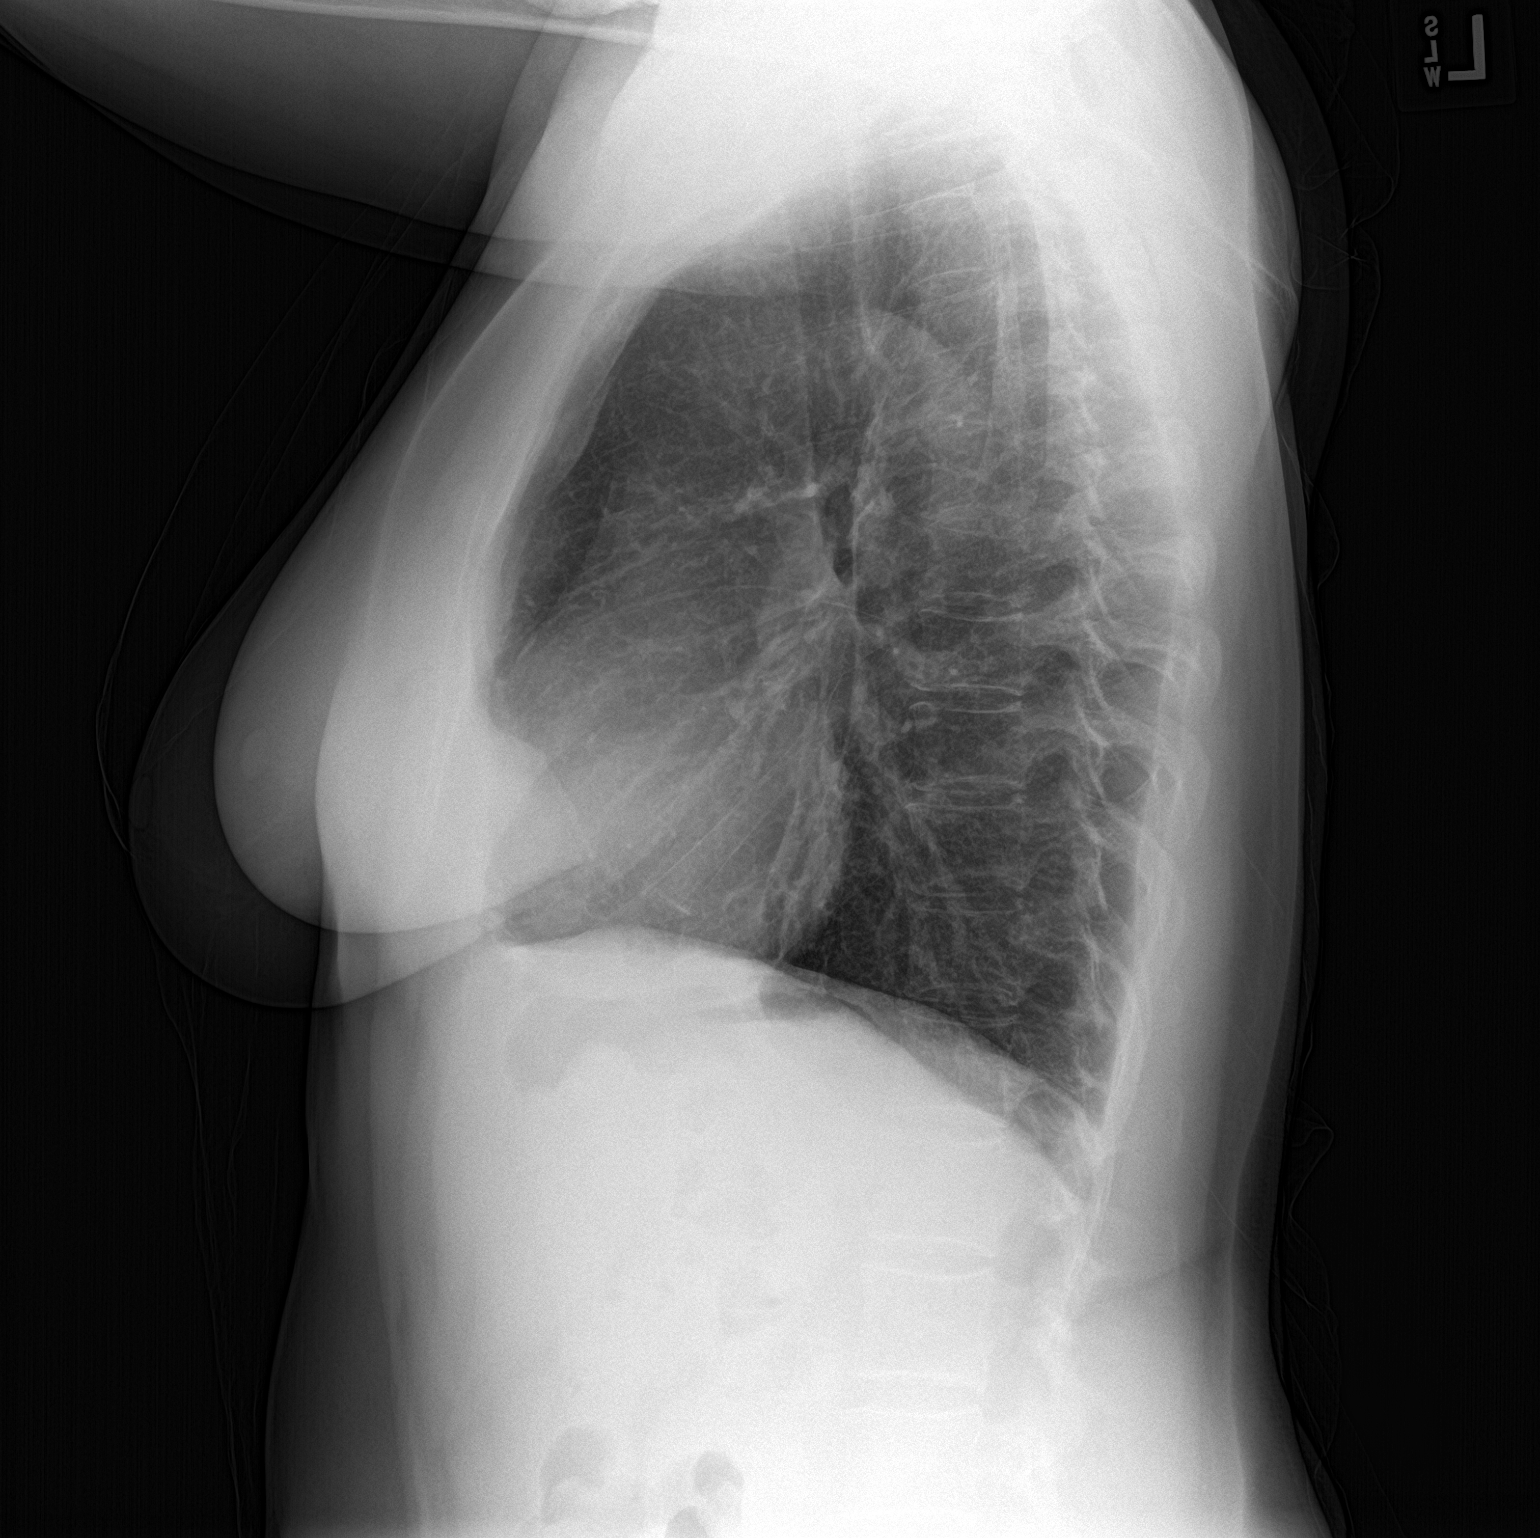

[2 of 2 positions shown; findings below may reference images not displayed]

FINDINGS: The heart size and mediastinal contours are within normal limits.
Mild bronchitic changes. Both lungs are clear. Mild scoliosis of the
spine.
IMPRESSION: Mild bronchitic changes.  No focal infiltrate.

## 2018-11-18 ENCOUNTER — Other Ambulatory Visit: Payer: Self-pay | Admitting: Osteopathic Medicine

## 2018-11-18 DIAGNOSIS — I10 Essential (primary) hypertension: Secondary | ICD-10-CM

## 2018-12-20 ENCOUNTER — Other Ambulatory Visit: Payer: Self-pay | Admitting: Osteopathic Medicine

## 2018-12-20 DIAGNOSIS — I10 Essential (primary) hypertension: Secondary | ICD-10-CM

## 2018-12-20 NOTE — Telephone Encounter (Signed)
Requested medication (s) are due for refill today: yes  Requested medication (s) are on the active medication list: yes  Last refill:  11/19/2018  Future visit scheduled:no  Notes to clinic: review for refill Overdue for office visit    Requested Prescriptions  Pending Prescriptions Disp Refills   lisinopril (ZESTRIL) 20 MG tablet [Pharmacy Med Name: LISINOPRIL 20 MG TABLET] 30 tablet 0    Sig: TAKE 1 TABLET BY MOUTH EVERY DAY     Cardiovascular:  ACE Inhibitors Failed - 12/20/2018  2:33 PM      Failed - Cr in normal range and within 180 days    Creat  Date Value Ref Range Status  08/09/2017 0.80 0.50 - 1.05 mg/dL Final    Comment:    For patients >51 years of age, the reference limit for Creatinine is approximately 13% higher for people identified as African-American. .          Failed - K in normal range and within 180 days    Potassium  Date Value Ref Range Status  08/09/2017 4.5 3.5 - 5.3 mmol/L Final         Failed - Last BP in normal range    BP Readings from Last 1 Encounters:  12/29/17 (!) 152/99         Failed - Valid encounter within last 6 months    Recent Outpatient Visits          11 months ago Cough   Anna Wilkinson, Rebekah [place], MD   1 year ago Colon cancer screening   [facility], Lanelle Bal, DO   1 year ago Annual physical exam   [facility], Lanelle Bal, DO   2 years ago Acute maxillary sinusitis, recurrence not specified   [facility] [facility], Lanelle Bal, DO   2 years ago Post-viral cough syndrome   [facility] [place], [place], [place] - Patient is not pregnant

## 2019-04-20 ENCOUNTER — Other Ambulatory Visit: Payer: Self-pay | Admitting: Osteopathic Medicine

## 2019-04-20 DIAGNOSIS — I10 Essential (primary) hypertension: Secondary | ICD-10-CM

## 2019-05-09 ENCOUNTER — Ambulatory Visit: Payer: Self-pay | Admitting: Physician Assistant

## 2019-05-29 ENCOUNTER — Other Ambulatory Visit: Payer: Self-pay | Admitting: Osteopathic Medicine

## 2019-05-29 DIAGNOSIS — I10 Essential (primary) hypertension: Secondary | ICD-10-CM

## 2019-06-13 ENCOUNTER — Other Ambulatory Visit: Payer: Self-pay | Admitting: Osteopathic Medicine

## 2019-06-13 DIAGNOSIS — I10 Essential (primary) hypertension: Secondary | ICD-10-CM
